# Patient Record
Sex: Male | Born: 1980 | Race: White | Hispanic: No | Marital: Married | Smoking: Current some day smoker
Health system: Southern US, Community
[De-identification: ages and names within clinical notes are randomized; demographics above are authoritative.]

---

## 2004-06-18 ENCOUNTER — Emergency Department: Payer: Self-pay | Admitting: Emergency Medicine

## 2004-09-09 ENCOUNTER — Emergency Department: Payer: Self-pay | Admitting: Emergency Medicine

## 2004-11-06 ENCOUNTER — Emergency Department: Payer: Self-pay | Admitting: Emergency Medicine

## 2005-04-20 ENCOUNTER — Emergency Department: Payer: Self-pay | Admitting: Emergency Medicine

## 2005-04-21 ENCOUNTER — Emergency Department: Payer: Self-pay | Admitting: Emergency Medicine

## 2005-05-23 ENCOUNTER — Emergency Department: Payer: Self-pay | Admitting: Unknown Physician Specialty

## 2005-06-19 ENCOUNTER — Emergency Department: Payer: Self-pay | Admitting: Emergency Medicine

## 2005-06-20 ENCOUNTER — Emergency Department: Payer: Self-pay | Admitting: Emergency Medicine

## 2005-07-19 ENCOUNTER — Emergency Department: Payer: Self-pay | Admitting: Emergency Medicine

## 2006-03-20 ENCOUNTER — Emergency Department: Payer: Self-pay | Admitting: Emergency Medicine

## 2006-03-26 ENCOUNTER — Emergency Department: Payer: Self-pay | Admitting: Emergency Medicine

## 2006-10-15 ENCOUNTER — Emergency Department: Payer: Self-pay | Admitting: Unknown Physician Specialty

## 2007-02-07 ENCOUNTER — Emergency Department: Payer: Self-pay | Admitting: Emergency Medicine

## 2007-03-11 ENCOUNTER — Emergency Department: Payer: Self-pay | Admitting: Emergency Medicine

## 2007-03-13 ENCOUNTER — Emergency Department (HOSPITAL_COMMUNITY): Admission: EM | Admit: 2007-03-13 | Discharge: 2007-03-13 | Payer: Self-pay | Admitting: Emergency Medicine

## 2007-03-17 ENCOUNTER — Emergency Department: Payer: Self-pay | Admitting: Emergency Medicine

## 2007-04-26 ENCOUNTER — Emergency Department: Payer: Self-pay | Admitting: Emergency Medicine

## 2008-05-29 ENCOUNTER — Emergency Department: Payer: Self-pay | Admitting: Emergency Medicine

## 2008-09-07 ENCOUNTER — Emergency Department: Payer: Self-pay | Admitting: Emergency Medicine

## 2008-12-01 ENCOUNTER — Emergency Department: Payer: Self-pay | Admitting: Internal Medicine

## 2009-03-08 ENCOUNTER — Emergency Department: Payer: Self-pay | Admitting: Emergency Medicine

## 2009-12-05 ENCOUNTER — Emergency Department: Payer: Self-pay | Admitting: Emergency Medicine

## 2009-12-07 ENCOUNTER — Emergency Department: Payer: Self-pay | Admitting: Emergency Medicine

## 2009-12-21 ENCOUNTER — Emergency Department: Payer: Self-pay | Admitting: Emergency Medicine

## 2010-01-26 ENCOUNTER — Emergency Department: Payer: Self-pay | Admitting: Emergency Medicine

## 2010-05-15 ENCOUNTER — Emergency Department: Payer: Self-pay | Admitting: Unknown Physician Specialty

## 2011-01-15 ENCOUNTER — Emergency Department: Payer: Self-pay | Admitting: Emergency Medicine

## 2012-08-21 ENCOUNTER — Emergency Department: Payer: Self-pay | Admitting: Internal Medicine

## 2012-08-21 LAB — COMPREHENSIVE METABOLIC PANEL
Albumin: 4 g/dL (ref 3.4–5.0)
Anion Gap: 4 — ABNORMAL LOW (ref 7–16)
BUN: 10 mg/dL (ref 7–18)
Bilirubin,Total: 0.1 mg/dL — ABNORMAL LOW (ref 0.2–1.0)
Calcium, Total: 9 mg/dL (ref 8.5–10.1)
Chloride: 108 mmol/L — ABNORMAL HIGH (ref 98–107)
Co2: 29 mmol/L (ref 21–32)
Creatinine: 0.74 mg/dL (ref 0.60–1.30)
EGFR (African American): >60
Glucose: 83 mg/dL (ref 65–99)
Potassium: 4.3 mmol/L (ref 3.5–5.1)

## 2012-08-21 LAB — URINALYSIS, COMPLETE
Bacteria: NONE SEEN
Bilirubin,UR: NEGATIVE
Ph: 6 (ref 4.5–8.0)
RBC,UR: NONE SEEN /HPF (ref 0–5)
Specific Gravity: 1.023 (ref 1.003–1.030)
Squamous Epithelial: NONE SEEN
WBC UR: <1 /HPF (ref 0–5)

## 2012-08-21 LAB — CBC WITH DIFFERENTIAL/PLATELET
Basophil #: 0.1 10*3/uL (ref 0.0–0.1)
Eosinophil %: 3.3 %
HCT: 45 % (ref 40.0–52.0)
HGB: 14.9 g/dL (ref 13.0–18.0)
Lymphocyte #: 2.5 10*3/uL (ref 1.0–3.6)
MCH: 30.3 pg (ref 26.0–34.0)
MCHC: 33 g/dL (ref 32.0–36.0)
Neutrophil %: 61.3 %
Platelet: 249 10*3/uL (ref 150–440)

## 2012-08-28 ENCOUNTER — Emergency Department: Payer: Self-pay | Admitting: Emergency Medicine

## 2012-08-29 LAB — URINALYSIS, COMPLETE
Blood: NEGATIVE
Ketone: NEGATIVE
Leukocyte Esterase: NEGATIVE
Protein: NEGATIVE
RBC,UR: <1 /HPF (ref 0–5)
Specific Gravity: 1.012 (ref 1.003–1.030)
Squamous Epithelial: NONE SEEN

## 2012-08-29 LAB — COMPREHENSIVE METABOLIC PANEL
Albumin: 4.6 g/dL (ref 3.4–5.0)
Anion Gap: 7 (ref 7–16)
Calcium, Total: 9.2 mg/dL (ref 8.5–10.1)
Glucose: 110 mg/dL — ABNORMAL HIGH (ref 65–99)
Osmolality: 277 (ref 275–301)
SGOT(AST): 24 U/L (ref 15–37)
Sodium: 139 mmol/L (ref 136–145)

## 2012-08-29 LAB — DRUG SCREEN, URINE
Amphetamines, Ur Screen: POSITIVE (ref ?–1000)
Benzodiazepine, Ur Scrn: NEGATIVE (ref ?–200)
Cannabinoid 50 Ng, Ur ~~LOC~~: NEGATIVE (ref ?–50)
Cocaine Metabolite,Ur ~~LOC~~: NEGATIVE (ref ?–300)
MDMA (Ecstasy)Ur Screen: NEGATIVE (ref ?–500)
Methadone, Ur Screen: NEGATIVE (ref ?–300)

## 2012-08-29 LAB — TSH: Thyroid Stimulating Horm: 1.77 u[IU]/mL

## 2012-08-29 LAB — CBC
HGB: 15.8 g/dL (ref 13.0–18.0)
RDW: 13.1 % (ref 11.5–14.5)
WBC: 13.7 10*3/uL — ABNORMAL HIGH (ref 3.8–10.6)

## 2012-08-30 ENCOUNTER — Inpatient Hospital Stay: Payer: Self-pay | Admitting: Psychiatry

## 2012-10-21 ENCOUNTER — Emergency Department: Payer: Self-pay | Admitting: Emergency Medicine

## 2012-10-21 LAB — CBC
HGB: 14.7 g/dL (ref 13.0–18.0)
MCH: 31.2 pg (ref 26.0–34.0)
MCV: 91 fL (ref 80–100)
RBC: 4.73 10*6/uL (ref 4.40–5.90)

## 2012-10-21 LAB — URINALYSIS, COMPLETE
Bacteria: NONE SEEN
Bilirubin,UR: NEGATIVE
Ketone: NEGATIVE
Protein: NEGATIVE
Squamous Epithelial: <1

## 2012-10-22 LAB — COMPREHENSIVE METABOLIC PANEL
Alkaline Phosphatase: 102 U/L (ref 50–136)
BUN: 19 mg/dL — ABNORMAL HIGH (ref 7–18)
Calcium, Total: 8.5 mg/dL (ref 8.5–10.1)
Chloride: 106 mmol/L (ref 98–107)
EGFR (African American): >60
SGOT(AST): 37 U/L (ref 15–37)
SGPT (ALT): 60 U/L (ref 12–78)
Total Protein: 7.6 g/dL (ref 6.4–8.2)

## 2014-09-12 ENCOUNTER — Emergency Department: Payer: Self-pay | Admitting: Emergency Medicine

## 2014-11-12 NOTE — Discharge Summary (Signed)
PATIENT NAME:  Gwyndolyn KaufmanFLYNN, River MR#:  [id number] DATE OF BIRTH:  17-Jun-1981  DATE OF ADMISSION:  08/30/2012 DATE OF DISCHARGE:    HOSPITAL COURSE: See dictated history and physical for details of admission. The patient is a 34 year old male with a history of opiate dependence admitted to the hospital asking for further treatment. In the hospital, it transpired that he really did not have any significant physical symptoms of opiate withdrawal having already detoxed. He remained physically stable without any new complaints. He did complain of chronic anxiety and intermittent depression. He was started on citalopram 20 mg a day and has tolerated this well. Situational anxiety has been treated with p.r.n. Vistaril. The patient initially was asking for discharge and follow-up in the community, but after speaking with his wife, he is more strongly encouraged to go to the alcohol and drug abuse treatment center. A bed is available today and he is willing to go there. He will have his wife pick him up for transport. Transportation is to be done today directly to [facility]. He was engaged in group and individual therapy about the importance of sobriety and the continued dangers of substance abuse.   DISCHARGE MEDICATIONS:  Citalopram 20 mg p.o. daily, Atarax 50 mg p.o. q.6h. p.r.n. for anxiety.   LABORATORY DATA: Drug screen positive for amphetamines, otherwise negative. Urinalysis normal. TSH normal at 1.7. Alcohol undetected. Chemistry unremarkable. CBC shows a slightly elevated white count at 13.7.   MENTAL STATUS EXAM AT DISCHARGE:  Neatly dressed and groomed man, looks his stated age. Cooperative with the interview. Good eye contact, normal psychomotor activity. Speech is normal in rate, tone and volume. Affect euthymic, reactive and appropriate. Mood is stated as good. Thoughts appear lucid and directed without any loosening of associations or delusions. Denies auditory or visual hallucinations. Denies suicidal or  homicidal ideation. Judgment and insight are improved. Baseline intelligence is normal, alert and oriented x 4.   DISPOSITION: As above, he is being discharged with a plan for him to be directly transferred to the [facility] today.   DIAGNOSIS PRINCIPAL AND PRIMARY:  AXIS I:  Opiate dependence.   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    Dysthymia.  AXIS II:  Deferred.  AXIS III:  No diagnosis.  AXIS IV:  Moderate to severe stress from the results of heavy substance abuse on his family.  AXIS V:  Functioning at time of discharge is 50.    ____________________________ Audery AmelJohn T. Morgon Pamer, MD jtc:ct D: 09/02/2012 12:36:04 ET T: 09/02/2012 12:46:39 ET JOB#: [id number]  cc: Audery AmelJohn T. Cynthie Garmon, MD, <Dictator> Audery AmelJOHN T Filipe Greathouse MD ELECTRONICALLY SIGNED 09/03/2012 10:11
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
## 2015-10-10 ENCOUNTER — Emergency Department: Payer: Self-pay

## 2015-10-10 ENCOUNTER — Emergency Department
Admission: EM | Admit: 2015-10-10 | Discharge: 2015-10-10 | Disposition: A | Discharge status: Home / Self Care | Payer: Self-pay | Attending: Emergency Medicine | Admitting: Emergency Medicine

## 2015-10-10 ENCOUNTER — Encounter: Payer: Self-pay | Admitting: Emergency Medicine

## 2015-10-10 DIAGNOSIS — R300 Dysuria: Secondary | ICD-10-CM | POA: Insufficient documentation

## 2015-10-10 DIAGNOSIS — R339 Retention of urine, unspecified: Secondary | ICD-10-CM | POA: Insufficient documentation

## 2015-10-10 DIAGNOSIS — N529 Male erectile dysfunction, unspecified: Secondary | ICD-10-CM | POA: Insufficient documentation

## 2015-10-10 DIAGNOSIS — F17200 Nicotine dependence, unspecified, uncomplicated: Secondary | ICD-10-CM | POA: Insufficient documentation

## 2015-10-10 LAB — CBC WITH DIFFERENTIAL/PLATELET
BASOS ABS: 0 10*3/uL (ref 0–0.1)
BASOS PCT: 0 %
EOS ABS: 0 10*3/uL (ref 0–0.7)
EOS PCT: 0 %
HCT: 43.8 % (ref 40.0–52.0)
Hemoglobin: 14.9 g/dL (ref 13.0–18.0)
Lymphocytes Relative: 22 %
Lymphs Abs: 1.4 10*3/uL (ref 1.0–3.6)
MCH: 30.2 pg (ref 26.0–34.0)
MCHC: 34 g/dL (ref 32.0–36.0)
MCV: 88.9 fL (ref 80.0–100.0)
MONO ABS: 0.4 10*3/uL (ref 0.2–1.0)
Monocytes Relative: 6 %
Neutro Abs: 4.6 10*3/uL (ref 1.4–6.5)
Neutrophils Relative %: 72 %
PLATELETS: 229 10*3/uL (ref 150–440)
RBC: 4.93 MIL/uL (ref 4.40–5.90)
RDW: 12.8 % (ref 11.5–14.5)
WBC: 6.3 10*3/uL (ref 3.8–10.6)

## 2015-10-10 LAB — URINALYSIS COMPLETE WITH MICROSCOPIC (ARMC ONLY)
BACTERIA UA: NONE SEEN
Bilirubin Urine: NEGATIVE
Glucose, UA: NEGATIVE mg/dL
Hgb urine dipstick: NEGATIVE
KETONES UR: NEGATIVE mg/dL
Leukocytes, UA: NEGATIVE
Nitrite: NEGATIVE
PH: 6 (ref 5.0–8.0)
PROTEIN: NEGATIVE mg/dL
SPECIFIC GRAVITY, URINE: 1.012 (ref 1.005–1.030)

## 2015-10-10 LAB — BASIC METABOLIC PANEL
Anion gap: 5 (ref 5–15)
BUN: 13 mg/dL (ref 6–20)
CALCIUM: 9.4 mg/dL (ref 8.9–10.3)
CO2: 27 mmol/L (ref 22–32)
Chloride: 105 mmol/L (ref 101–111)
Creatinine, Ser: 0.93 mg/dL (ref 0.61–1.24)
GFR calc Af Amer: >60 mL/min (ref >60)
GLUCOSE: 95 mg/dL (ref 65–99)
Potassium: 4.2 mmol/L (ref 3.5–5.1)
SODIUM: 137 mmol/L (ref 135–145)

## 2015-10-10 MED ORDER — KETOROLAC TROMETHAMINE 30 MG/ML IJ SOLN
30.0000 mg | Freq: Once | INTRAMUSCULAR | Status: AC
  Administered 2015-10-10: 30 mg via INTRAVENOUS
  Filled 2015-10-10: qty 1

## 2015-10-10 MED ORDER — SODIUM CHLORIDE 0.9 % IV BOLUS (SEPSIS)
500.0000 mL | Freq: Once | INTRAVENOUS | Status: AC
  Administered 2015-10-10: 500 mL via INTRAVENOUS

## 2015-10-10 MED ORDER — CIPROFLOXACIN HCL 500 MG PO TABS: 500.0000 mg | ORAL_TABLET | Freq: Two times a day (BID) | ORAL | Status: AC

## 2015-10-10 NOTE — Discharge Instructions (Signed)
Acute Urinary Retention, Male °Acute urinary retention is the temporary inability to urinate. °This is a common problem in older men. As men age their prostates become larger and block the flow of urine from the bladder. This is usually a problem that has come on gradually.  °HOME CARE INSTRUCTIONS °If you are sent home with a Foley catheter and a drainage system, you will need to discuss the best course of action with your health care provider. While the catheter is in, maintain a good intake of fluids. Keep the drainage bag emptied and lower than your catheter. This is so that contaminated urine will not flow back into your bladder, which could lead to a urinary tract infection. °There are two main types of drainage bags. One is a large bag that usually is used at night. It has a good capacity that will allow you to sleep through the night without having to empty it. The second type is called a leg bag. It has a smaller capacity, so it needs to be emptied more frequently. However, the main advantage is that it can be attached by a leg strap and can go underneath your clothing, allowing you the freedom to move about or leave your home. °Only take over-the-counter or prescription medicines for pain, discomfort, or fever as directed by your health care provider.  °SEEK MEDICAL CARE IF: °· You develop a low-grade fever. °· You experience spasms or leakage of urine with the spasms. °SEEK IMMEDIATE MEDICAL CARE IF:  °· You develop chills or fever. °· Your catheter stops draining urine. °· Your catheter falls out. °· You start to develop increased bleeding that does not respond to rest and increased fluid intake. °MAKE SURE YOU: °· Understand these instructions. °· Will watch your condition. °· Will get help right away if you are not doing well or get worse. °  °This information is not intended to replace advice given to you by your health care provider. Make sure you discuss any questions you have with your health care  provider. °  °Document Released: 10/15/2000 Document Revised: 11/23/2014 Document Reviewed: 12/18/2012 °Elsevier Interactive Patient Education ©2016 Elsevier Inc. ° °

## 2015-10-10 NOTE — ED Provider Notes (Signed)
Time Seen: Approximately 1426 I have reviewed the triage notes  Chief Complaint: Urinary Retention and Dysuria   History of Present Illness: Stephen Pearson is a 35 y.o. male who presents with a one-month history of some urologic complaints of decreased ability for retractions, urinary retention, and some left flank pain. Patient has to strain on occasion to urinate. He denies any hematuria and states when he is able to urinate he seems to have a steady stream. He denies any fever or penile discharge or drainage. He denies any difficulty with bowel movements or low back discomfort. Pain is in the left flank area sharp in nature with some mild associated nausea but no persistent vomiting. He denies any right-sided abdominal or flank pain.   History reviewed. No pertinent past medical history.  There are no active problems to display for this patient.   History reviewed. No pertinent past surgical history.  History reviewed. No pertinent past surgical history.  No current outpatient prescriptions on file.  Allergies:  Review of patient's allergies indicates no known allergies.  Family History: No family history on file.  Social History: Social History  Substance Use Topics  . Smoking status: Current Some Day Smoker  . Smokeless tobacco: None  . Alcohol Use: Yes     Review of Systems:   10 point review of systems was performed and was otherwise negative:  Constitutional: No fever Eyes: No visual disturbances ENT: No sore throat, ear pain Cardiac: No chest pain Respiratory: No shortness of breath, wheezing, or stridor Abdomen: No abdominal pain, no vomiting, No diarrhea Endocrine: No weight loss, No night sweats Extremities: No peripheral edema, cyanosis Skin: No rashes, easy bruising Neurologic: No focal weakness, trouble with speech or swollowing Urologic: Mostly urinary retention no hematuria Difficulty developing interaction His wife states that he has not had any  testicular pain or palpable masses.  Physical Exam:  ED Triage Vitals  Enc Vitals Group     BP 10/10/15 1155 136/80 mmHg     Pulse Rate 10/10/15 1155 91     Resp 10/10/15 1152 20     Temp 10/10/15 1152 98.5 F (36.9 C)     Temp Source 10/10/15 1152 Oral     SpO2 10/10/15 1152 91 %     Weight 10/10/15 1152 184 lb (83.462 kg)     Height 10/10/15 1152  (1.676 m)     Head Cir --      Peak Flow --      Pain Score 10/10/15 1153 10     Pain Loc --      Pain Edu? --      Excl. in GC? --     General: Awake , Alert , and Oriented times 3; GCS 15 Head: Normal cephalic , atraumatic Eyes: Pupils equal , round, reactive to light Nose/Throat: No nasal drainage, patent upper airway without erythema or exudate.  Neck: Supple, Full range of motion, No anterior adenopathy or palpable thyroid masses Lungs: Clear to ascultation without wheezes , rhonchi, or rales Heart: Regular rate, regular rhythm without murmurs , gallops , or rubs Abdomen: Soft, non tender without rebound, guarding , or rigidity; bowel sounds positive and symmetric in all 4 quadrants. No organomegaly .        Extremities: 2 plus symmetric pulses. No edema, clubbing or cyanosis Neurologic: normal ambulation, Motor symmetric without deficits, sensory intact Skin: warm, dry, no rashes   Labs:   All laboratory work was reviewed including any pertinent  negatives or positives listed below:  Labs Reviewed  URINALYSIS COMPLETEWITH MICROSCOPIC ([facility] ONLY) - Abnormal; Notable for the following:    Color, Urine YELLOW (*)    APPearance CLEAR (*)    Squamous Epithelial / LPF 0-5 (*)    All other components within normal limits  CBC WITH DIFFERENTIAL/PLATELET  BASIC METABOLIC PANEL  Laboratory work was reviewed and showed no clinically significant abnormalities.   Radiology:   CLINICAL DATA: Low back pain, urinary retention and painful urination for 1 month  EXAM: CT ABDOMEN AND PELVIS WITHOUT  CONTRAST  TECHNIQUE: Multidetector CT imaging of the abdomen and pelvis was performed following the standard protocol without IV contrast.  COMPARISON: CT scan 03/11/2007  FINDINGS: Lower chest: The lung bases are unremarkable.  Hepatobiliary: There is a low-density lesion in right hepatic dome anteriorly with central decreased attenuation. This lesion measures 3.6 x 3.4 cm. On the prior exam this lesion measures 3 x 2.9 cm. Therefore there is progression in size. This may represent a benign lesion such as focal nodular hyperplasia however cannot be characterized without IV contrast. Evaluation with nonemergent enhanced CT or preferably enhanced MRI is recommended. No intrahepatic biliary ductal dilatation. No calcified gallstones are noted within gallbladder. No CBD dilatation.  Pancreas: Unenhanced pancreas shows no focal abnormality or peripancreatic inflammation.  Spleen: Unremarkable  Adrenals/Urinary Tract: No adrenal gland mass is noted. Unenhanced kidneys are symmetrical in size. No hydronephrosis or hydroureter. No nephrolithiasis. Bilateral no calcified ureteral calculi are noted. No calcified calculi are noted within urinary bladder.  Stomach/Bowel: There is no small bowel obstruction. No pericecal inflammation. Normal appendix. The terminal ileum is unremarkable. Some colonic stool noted in right colon proximal transverse colon. Some colonic gas noted in distal transverse colon. No distal colonic obstruction. No colitis or diverticulitis.  Vascular/Lymphatic: No evidence of aortic aneurysm. No mesenteric or retroperitoneal adenopathy. There is no inguinal adenopathy.  Reproductive: Punctate calcifications are noted within prostate gland. Prostate gland measures 3.9 by 2.9 cm. The seminal vesicles are unremarkable. Calcified pelvic phleboliths are noted.  Other: No ascites or free air. Tiny umbilical hernia containing fat without evidence of acute  complication.  Musculoskeletal: No abdominal wall mass or fluid collection. Sagittal images of the spine are unremarkable. Alignment, disc spaces and vertebral body heights are preserved.  IMPRESSION: 1. No evidence of nephrolithiasis. No hydronephrosis or hydroureter. 2. No calcified ureteral calculi. 3. There is a low-density lesion in right hepatic dome anteriorly with central decreased attenuation. This lesion measures 3.6 x 3.4 cm. On the prior exam this lesion measures 3 x 2.9 cm. Therefore there is progression in size. This may represent a benign lesion such as focal nodular hyperplasia however cannot be characterized without IV contrast. Evaluation with nonemergent enhanced CT or preferably enhanced MRI is recommended. 4. Tiny umbilical hernia containing fat without evidence of acute complication. 5. No pericecal inflammation. Normal appendix. 6. No calcified calculi are noted within urinary bladder.   Electronically Signed By: Natasha Mead M.D.   I personally reviewed the radiologic studies     ED Course: Bladder scan shows 274 mL of retained urine Patient's renal function appears to be normal along with normal results on his urinalysis and on his CAT scan. This does not appear to be any obstructive uropathy. No evidence of renal colic or prostate masses. Denies any low back pain but still has symptoms consistent with urinary retention. I felt he may have some prostatitis and he'll be established on ciprofloxacin and, bed by no  means is his workup complete. Patient was encouraged to contact the urologist on call for unassigned patients. He's been advised drink plenty of fluids and take ibuprofen or Tylenol for pain. We discussed the cyst that was seen on his CAT scan and he was advised to follow-up with her primary physician and may need a repeat CAT scan in next 6 months to a year. 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 I felt no way contributed to the  patient's symptoms that he presents to the emergency department.    Assessment:  Urinary retention Erectile dysfunction  Final Clinical Impression:   Final diagnoses:  Urinary retention     Plan: * Outpatient management Patient was advised to return immediately if condition worsens. Patient was advised to follow up with their primary care physician or other specialized physicians involved in their outpatient care. The patient and/or family member/power of attorney had laboratory results reviewed at the bedside. All questions and concerns were addressed and appropriate discharge instructions were distributed by the nursing staff.            Jennye MoccasinBrian S Quigley, MD 10/10/15 [phone]

## 2015-10-10 NOTE — ED Notes (Signed)
Pt presents with low back pain and urinary retention with some painful urination for one month. Pt denies any other pain at this time.

## 2015-10-10 NOTE — ED Notes (Signed)
Bladder scan 274 ml, MD notified

## 2015-10-10 NOTE — ED Notes (Signed)
Pt states burning upon urination and retention for 1 month, states pain his back, wife states pt has brusies on his stomach form where he has hit his abdomen trying to start urinating, pt awake and alert and oriented in no acute distress

## 2017-07-22 ENCOUNTER — Encounter: Payer: Self-pay | Admitting: Emergency Medicine

## 2017-07-22 ENCOUNTER — Emergency Department

## 2017-07-22 ENCOUNTER — Other Ambulatory Visit: Payer: Self-pay

## 2017-07-22 ENCOUNTER — Emergency Department
Admission: EM | Admit: 2017-07-22 | Discharge: 2017-07-22 | Disposition: A | Discharge status: Home / Self Care | Attending: Emergency Medicine | Admitting: Emergency Medicine

## 2017-07-22 DIAGNOSIS — Y939 Activity, unspecified: Secondary | ICD-10-CM | POA: Insufficient documentation

## 2017-07-22 DIAGNOSIS — S0280XA Fracture of other specified skull and facial bones, unspecified side, initial encounter for closed fracture: Secondary | ICD-10-CM | POA: Diagnosis not present

## 2017-07-22 DIAGNOSIS — Y999 Unspecified external cause status: Secondary | ICD-10-CM | POA: Diagnosis not present

## 2017-07-22 DIAGNOSIS — S0240DB Maxillary fracture, left side, initial encounter for open fracture: Secondary | ICD-10-CM | POA: Diagnosis not present

## 2017-07-22 DIAGNOSIS — S022XXA Fracture of nasal bones, initial encounter for closed fracture: Secondary | ICD-10-CM | POA: Diagnosis not present

## 2017-07-22 DIAGNOSIS — S0993XA Unspecified injury of face, initial encounter: Secondary | ICD-10-CM

## 2017-07-22 DIAGNOSIS — S0181XA Laceration without foreign body of other part of head, initial encounter: Secondary | ICD-10-CM | POA: Insufficient documentation

## 2017-07-22 DIAGNOSIS — S0285XA Fracture of orbit, unspecified, initial encounter for closed fracture: Secondary | ICD-10-CM

## 2017-07-22 DIAGNOSIS — S0240CB Maxillary fracture, right side, initial encounter for open fracture: Secondary | ICD-10-CM | POA: Insufficient documentation

## 2017-07-22 DIAGNOSIS — F17210 Nicotine dependence, cigarettes, uncomplicated: Secondary | ICD-10-CM | POA: Diagnosis not present

## 2017-07-22 DIAGNOSIS — Y92149 Unspecified place in prison as the place of occurrence of the external cause: Secondary | ICD-10-CM | POA: Diagnosis not present

## 2017-07-22 LAB — COMPREHENSIVE METABOLIC PANEL
ALT: 29 U/L (ref 17–63)
AST: 19 U/L (ref 15–41)
Albumin: 4.2 g/dL (ref 3.5–5.0)
Alkaline Phosphatase: 85 U/L (ref 38–126)
Anion gap: 7 (ref 5–15)
BUN: 17 mg/dL (ref 6–20)
CHLORIDE: 100 mmol/L — AB (ref 101–111)
CO2: 28 mmol/L (ref 22–32)
CREATININE: 0.78 mg/dL (ref 0.61–1.24)
Calcium: 8.9 mg/dL (ref 8.9–10.3)
GFR calc Af Amer: >60 mL/min (ref >60)
GFR calc non Af Amer: >60 mL/min (ref >60)
Glucose, Bld: 104 mg/dL — ABNORMAL HIGH (ref 65–99)
Potassium: 4.1 mmol/L (ref 3.5–5.1)
SODIUM: 135 mmol/L (ref 135–145)
Total Bilirubin: 0.7 mg/dL (ref 0.3–1.2)
Total Protein: 7.2 g/dL (ref 6.5–8.1)

## 2017-07-22 LAB — CBC
HCT: 47 % (ref 40.0–52.0)
Hemoglobin: 15.9 g/dL (ref 13.0–18.0)
MCH: 29.9 pg (ref 26.0–34.0)
MCHC: 33.7 g/dL (ref 32.0–36.0)
MCV: 88.6 fL (ref 80.0–100.0)
PLATELETS: 265 10*3/uL (ref 150–440)
RBC: 5.3 MIL/uL (ref 4.40–5.90)
RDW: 12.9 % (ref 11.5–14.5)
WBC: 13.7 10*3/uL — AB (ref 3.8–10.6)

## 2017-07-22 MED ORDER — FENTANYL CITRATE (PF) 100 MCG/2ML IJ SOLN
50.0000 ug | Freq: Once | INTRAMUSCULAR | Status: AC
  Administered 2017-07-22: 50 ug via INTRAVENOUS
  Filled 2017-07-22: qty 2

## 2017-07-22 MED ORDER — LIDOCAINE HCL (PF) 1 % IJ SOLN
5.0000 mL | Freq: Once | INTRAMUSCULAR | Status: AC
  Administered 2017-07-22: 5 mL via INTRADERMAL
  Filled 2017-07-22: qty 5

## 2017-07-22 MED ORDER — LIDOCAINE HCL (PF) 1 % IJ SOLN
INTRAMUSCULAR | Status: AC
  Filled 2017-07-22: qty 5

## 2017-07-22 MED ORDER — OXYCODONE-ACETAMINOPHEN 5-325 MG PO TABS
1.0000 | ORAL_TABLET | Freq: Once | ORAL | Status: AC
  Administered 2017-07-22: 1 via ORAL
  Filled 2017-07-22: qty 1
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
## 2017-07-22 NOTE — ED Notes (Signed)
Pt asking for something to drink. Advised pt per PA, he is NPO.

## 2017-07-22 NOTE — ED Notes (Signed)
[facility] and Medical Necessity documentation reviewed at this time and noted complete per policy.

## 2019-05-01 ENCOUNTER — Emergency Department (INDEPENDENT_AMBULATORY_CARE_PROVIDER_SITE_OTHER)
Admission: EM | Admit: 2019-05-01 | Discharge: 2019-05-01 | Disposition: A | Discharge status: Home / Self Care | Payer: Self-pay | Source: Home / Self Care | Attending: Family Medicine | Admitting: Family Medicine

## 2019-05-01 ENCOUNTER — Other Ambulatory Visit: Payer: Self-pay

## 2019-05-01 DIAGNOSIS — M009 Pyogenic arthritis, unspecified: Secondary | ICD-10-CM

## 2019-05-01 NOTE — ED Provider Notes (Signed)
[facility]    CSN: [id number] Arrival date & time: 05/01/19  1808      History   Chief Complaint Chief Complaint  Patient presents with  . Abscess    HPI KENSON GROH is a 38 y.o. male.   Patient complains of onset of a small "pimple" on the dorsum of his left second finger PIP joint about 3 days ago, followed by gradual swelling and pain of the finger. He states that he squeezed the joint today and a significant amount of pus drained from the lesion.  He reports that his finger has become increasingly swollen/painful, with swelling extending to his hand.  He has had chills today.  The history is provided by the patient and the spouse.  Abscess Location:  Finger Finger abscess location:  L index finger Abscess quality: draining, fluctuance, painful, redness and warmth   Red streaking: no   Duration:  3 days Progression:  Worsening Pain details:    Quality:  Throbbing   Severity:  Moderate   Duration:  3 days   Timing:  Constant   Progression:  Worsening Chronicity:  New Context: skin injury   Relieved by:  Nothing Worsened by:  Draining/squeezing Ineffective treatments:  Draining/squeezing Associated symptoms: fatigue     History reviewed. No pertinent past medical history.  There are no active problems to display for this patient.   History reviewed. No pertinent surgical history.     Home Medications    Prior to Admission medications   Not on File    Family History History reviewed. No pertinent family history.  Social History Social History   Tobacco Use  . Smoking status: Current Some Day Smoker  . Smokeless tobacco: Never Used  Substance Use Topics  . Alcohol use: Not Currently  . Drug use: Not Currently     Allergies   Patient has no known allergies.   Review of Systems Review of Systems  Constitutional: Positive for chills and fatigue.  Musculoskeletal: Positive for joint swelling.  Skin: Positive for color change  and wound.  All other systems reviewed and are negative.    Physical Exam Triage Vital Signs ED Triage Vitals  Enc Vitals Group     BP 05/01/19 1954 (!) 153/103     Pulse Rate 05/01/19 1954 95     Resp 05/01/19 1954 20     Temp 05/01/19 1954 (!) 100.8 F (38.2 C)     Temp Source 05/01/19 1954 Oral     SpO2 05/01/19 1954 98 %     Weight 05/01/19 1956 220 lb (99.8 kg)     Height 05/01/19 1956 5\' 8"  (1.727 m)     Head Circumference --      Peak Flow --      Pain Score 05/01/19 1956 10     Pain Loc --      Pain Edu? --      Excl. in [place]? --    No data found.  Updated Vital Signs BP (!) 153/103 (BP Location: Right Arm)   Pulse 95   Temp (!) 100.8 F (38.2 C) (Oral)   Resp 20   Ht 5\' 8"  (1.727 m)   Wt 99.8 kg   SpO2 98%   BMI 33.45 kg/m   Visual Acuity Right Eye Distance:   Left Eye Distance:   Bilateral Distance:    Right Eye Near:   Left Eye Near:    Bilateral Near:     Physical Exam Vitals  signs and nursing note reviewed.  Constitutional:      General: He is not in acute distress.    Appearance: He is obese.  HENT:     Head: Normocephalic.  Eyes:     Pupils: Pupils are equal, round, and reactive to light.  Cardiovascular:     Rate and Rhythm: Normal rate.  Pulmonary:     Effort: Pulmonary effort is normal.  Musculoskeletal:     Left hand: He exhibits decreased range of motion, tenderness and swelling.       Hands:     Comments: Left second finger PIP joint swollen, fluctuant with excoriation on dorsal surface of PIP joint.  Tenderness extends to second MCP joint and dorsum of hand.  Patient unable to actively flex finger at PIP joint.  Skin:    General: Skin is warm and dry.  Neurological:     Mental Status: He is alert.      UC Treatments / Results  Labs (all labs ordered are listed, but only abnormal results are displayed) Labs Reviewed -  CBC:  WBC 13.8; LY 12.3; MO 4.2; GR 83.5; Hgb 14.2; Platelets 227   EKG   Radiology No results  found.  Procedures Procedures (including critical care time)  Medications Ordered in UC Medications - No data to display  Initial Impression / Assessment and Plan / UC Course  I have reviewed the triage vital signs and the nursing notes.  Pertinent labs & imaging results that were available during my care of the patient were reviewed by me and considered in my medical decision making (see chart for details).    Note fever 100.8 and leukocytosis (WBC 13.8).  Patient advised to proceed immediately to [facility] ED for further evaluation and treatment.   Final Clinical Impressions(s) / UC Diagnoses   Final diagnoses:  Septic arthritis of interphalangeal joint of finger of left hand [facility])   Discharge Instructions   None    ED Prescriptions    None        Lattie Haw, MD 05/01/19 2106

## 2019-05-01 NOTE — ED Triage Notes (Signed)
Pt has an abscess that is on the left pointer finger.  Red fluid draining from finger

## 2019-05-04 ENCOUNTER — Telehealth (INDEPENDENT_AMBULATORY_CARE_PROVIDER_SITE_OTHER): Payer: Self-pay

## 2019-05-04 DIAGNOSIS — M009 Pyogenic arthritis, unspecified: Secondary | ICD-10-CM

## 2019-05-04 LAB — POCT CBC W AUTO DIFF (K'VILLE URGENT CARE)

## 2019-05-04 NOTE — Telephone Encounter (Signed)
Spoke with patient, did not go to ER Friday night.  Went Saturday to [facility], and given IV antibiotic and oral antibiotic.  He is going back today because it is not any better.
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
## 2022-03-02 ENCOUNTER — Emergency Department
Admission: EM | Admit: 2022-03-02 | Discharge: 2022-03-02 | Disposition: A | Discharge status: Home / Self Care | Payer: Self-pay | Attending: Emergency Medicine | Admitting: Emergency Medicine

## 2022-03-02 ENCOUNTER — Other Ambulatory Visit: Payer: Self-pay

## 2022-03-02 DIAGNOSIS — R6884 Jaw pain: Secondary | ICD-10-CM

## 2022-03-02 DIAGNOSIS — S02600D Fracture of unspecified part of body of mandible, subsequent encounter for fracture with routine healing: Secondary | ICD-10-CM | POA: Insufficient documentation

## 2022-03-02 DIAGNOSIS — K0889 Other specified disorders of teeth and supporting structures: Secondary | ICD-10-CM

## 2022-03-02 DIAGNOSIS — W2105XD Struck by basketball, subsequent encounter: Secondary | ICD-10-CM | POA: Insufficient documentation

## 2022-03-02 LAB — CBC WITH DIFFERENTIAL/PLATELET
Abs Immature Granulocytes: 0.05 10*3/uL (ref 0.00–0.07)
Basophils Absolute: 0 10*3/uL (ref 0.0–0.1)
Basophils Relative: 0 %
Eosinophils Absolute: 0.1 10*3/uL (ref 0.0–0.5)
Eosinophils Relative: 2 %
HCT: 42 % (ref 39.0–52.0)
Hemoglobin: 13.5 g/dL (ref 13.0–17.0)
Immature Granulocytes: 1 %
Lymphocytes Relative: 27 %
Lymphs Abs: 2.3 10*3/uL (ref 0.7–4.0)
MCH: 29.3 pg (ref 26.0–34.0)
MCHC: 32.1 g/dL (ref 30.0–36.0)
MCV: 91.1 fL (ref 80.0–100.0)
Monocytes Absolute: 0.7 10*3/uL (ref 0.1–1.0)
Monocytes Relative: 8 %
Neutro Abs: 5.2 10*3/uL (ref 1.7–7.7)
Neutrophils Relative %: 62 %
Platelets: 302 10*3/uL (ref 150–400)
RBC: 4.61 MIL/uL (ref 4.22–5.81)
RDW: 13.1 % (ref 11.5–15.5)
WBC: 8.4 10*3/uL (ref 4.0–10.5)
nRBC: 0 % (ref 0.0–0.2)

## 2022-03-02 LAB — COMPREHENSIVE METABOLIC PANEL
ALT: 17 U/L (ref 0–44)
AST: 14 U/L — ABNORMAL LOW (ref 15–41)
Albumin: 3.9 g/dL (ref 3.5–5.0)
Alkaline Phosphatase: 66 U/L (ref 38–126)
Anion gap: 7 (ref 5–15)
BUN: 20 mg/dL (ref 6–20)
CO2: 29 mmol/L (ref 22–32)
Calcium: 9.1 mg/dL (ref 8.9–10.3)
Chloride: 105 mmol/L (ref 98–111)
Creatinine, Ser: 0.77 mg/dL (ref 0.61–1.24)
GFR, Estimated: >60 mL/min (ref >60)
Glucose, Bld: 88 mg/dL (ref 70–99)
Potassium: 4.6 mmol/L (ref 3.5–5.1)
Sodium: 141 mmol/L (ref 135–145)
Total Bilirubin: 0.7 mg/dL (ref 0.3–1.2)
Total Protein: 7.5 g/dL (ref 6.5–8.1)

## 2022-03-02 LAB — LACTIC ACID, PLASMA: Lactic Acid, Venous: 1 mmol/L (ref 0.5–1.9)

## 2022-03-02 MED ORDER — HYDROCODONE-ACETAMINOPHEN 7.5-325 MG/15ML PO SOLN: 10.0000 mL | Freq: Four times a day (QID) | ORAL | 0 refills | Status: AC | PRN
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
## 2023-10-14 ENCOUNTER — Telehealth: Payer: Self-pay | Admitting: Physician Assistant

## 2023-10-14 NOTE — Telephone Encounter (Signed)
 Copied from CRM [id number]. Topic: General - Registration Update >> Oct 14, 2023 12:30 PM Carla L wrote: Please update patient's phone number.   See CRM: [id number]  Possible wrong number on file.

## 2023-10-15 ENCOUNTER — Ambulatory Visit: Payer: MEDICAID | Admitting: Family Medicine

## 2023-10-16 NOTE — Telephone Encounter (Signed)
 Number has been updated . I called the patient to confirm .
